# Patient Record
Sex: Male | Born: 1950 | Race: Black or African American | Hispanic: No | Marital: Married
Health system: Southern US, Community
[De-identification: ages and names within clinical notes are randomized; demographics above are authoritative.]

---

## 2004-07-26 ENCOUNTER — Inpatient Hospital Stay (HOSPITAL_COMMUNITY): Admission: EM | Admit: 2004-07-26 | Discharge: 2004-07-30 | Payer: Self-pay | Admitting: Emergency Medicine

## 2005-03-03 ENCOUNTER — Ambulatory Visit: Payer: Self-pay | Admitting: Psychiatry

## 2005-03-03 ENCOUNTER — Other Ambulatory Visit (HOSPITAL_COMMUNITY): Admission: RE | Admit: 2005-03-03 | Discharge: 2005-06-01 | Payer: Self-pay | Admitting: Psychiatry

## 2005-05-04 ENCOUNTER — Other Ambulatory Visit (HOSPITAL_COMMUNITY): Admission: RE | Admit: 2005-05-04 | Discharge: 2005-08-02 | Payer: Self-pay | Admitting: Psychiatry

## 2005-11-20 IMAGING — CT CT HEAD W/O CM
1 of 2 series · 13 of 30 positions shown, 17 images · non-contrast
Comparison: 07/26/04.

CLINICAL DATA: Followup subarachnoid hemorrhage.
 CT HEAD WITHOUT CONTRAST

[Series 2: brain · axial · 0.47mm/px · z∈[+164,+282]mm · 13 of 40 slices shown, 17 images]
[im 3/40  brain]
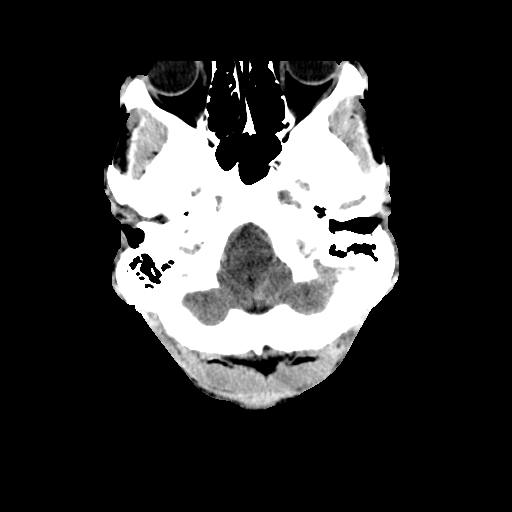
[im 3/40  bone]
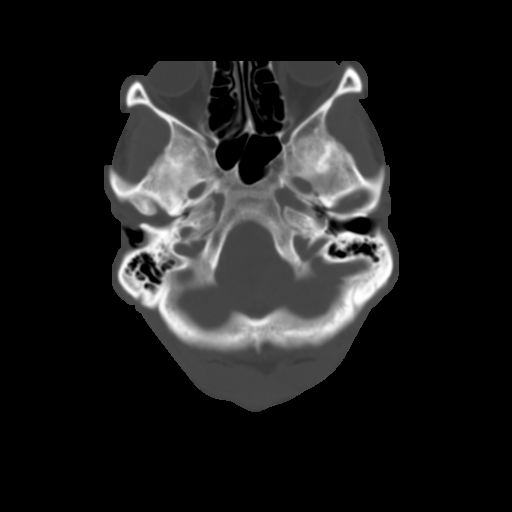
[im 6/40  brain]
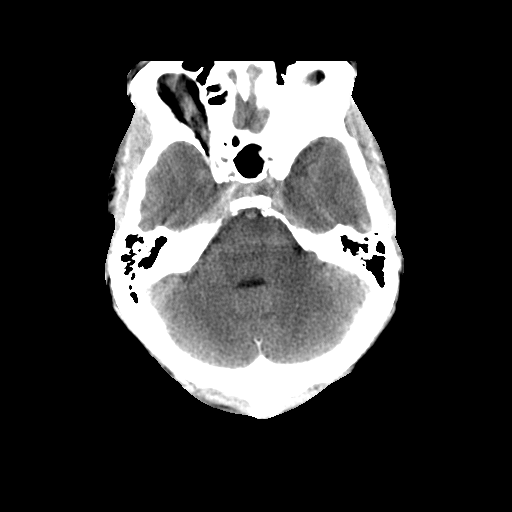
[im 9/40  brain]
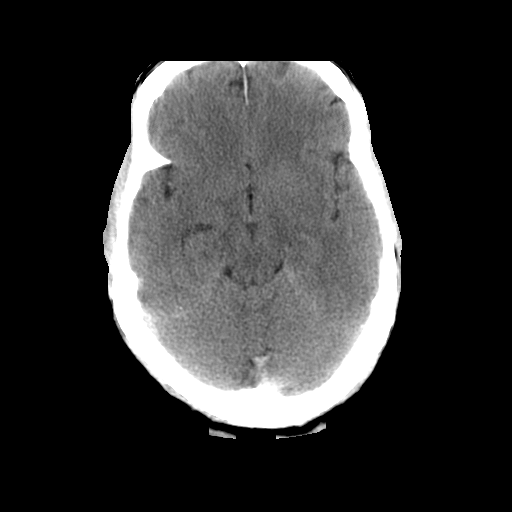
[im 12/40  brain]
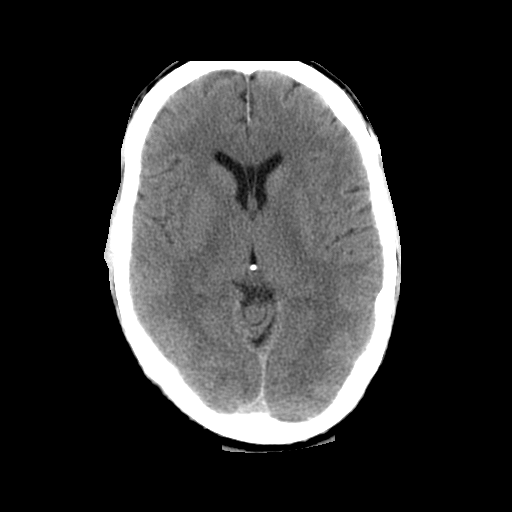
[im 14/40  brain]
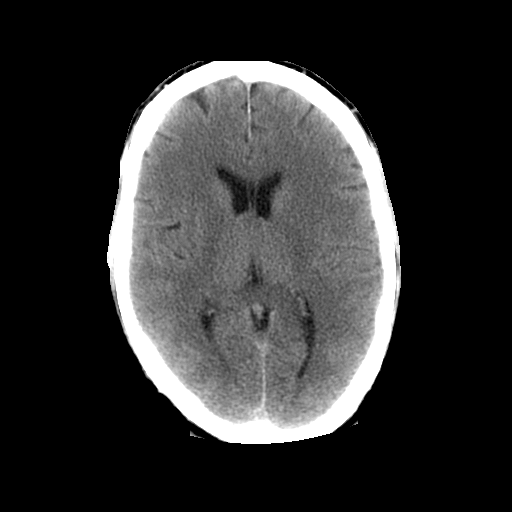
[im 14/40  bone]
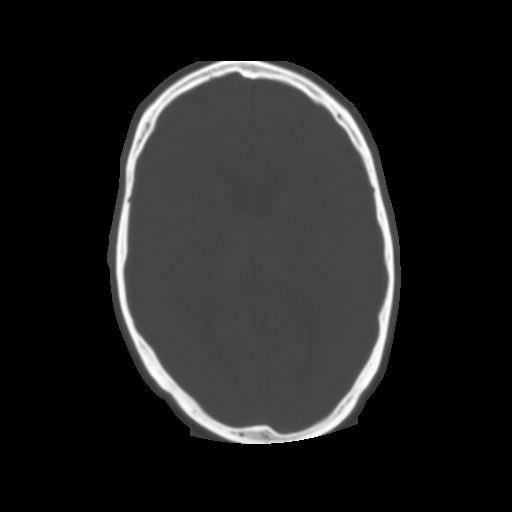
[im 17/40  brain]
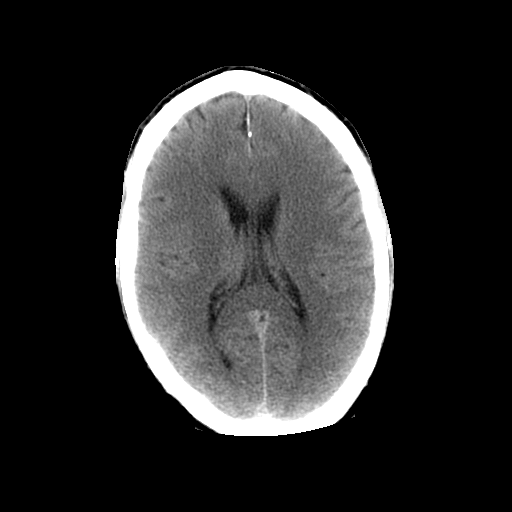
[im 20/40  brain]
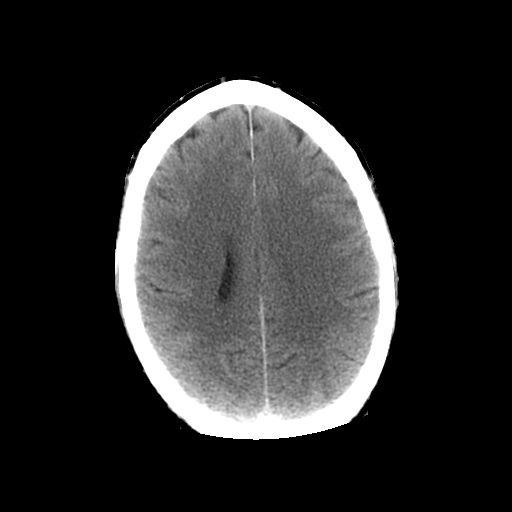
[im 23/40  brain]
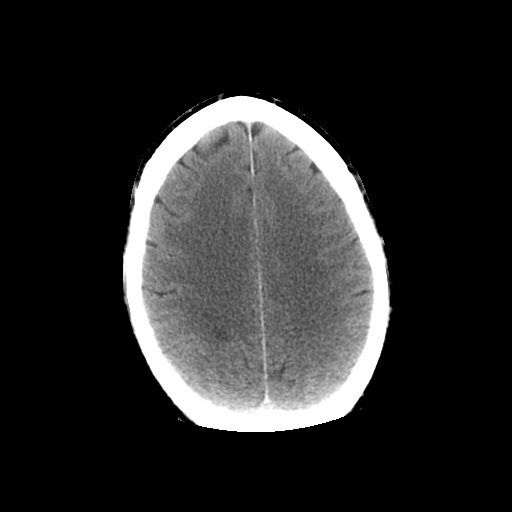
[im 26/40  brain]
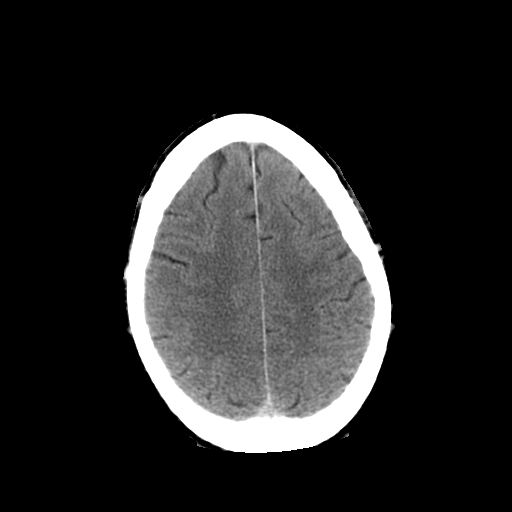
[im 26/40  bone]
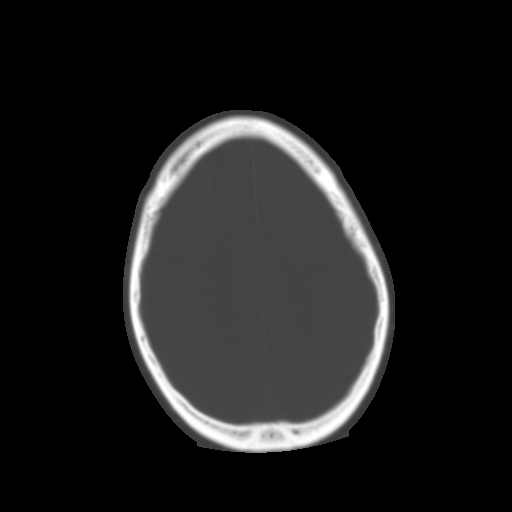
[im 28/40  brain]
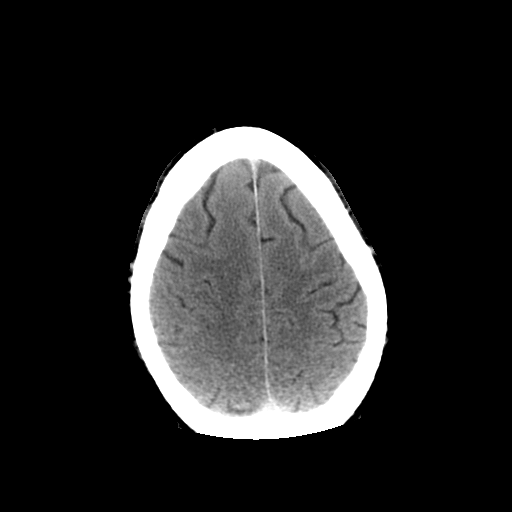
[im 31/40  brain]
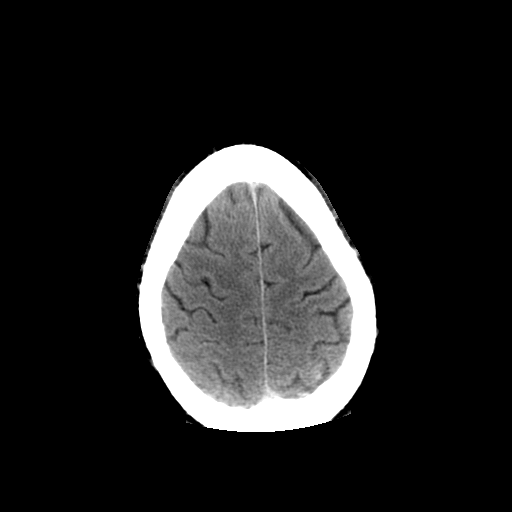
[im 34/40  brain]
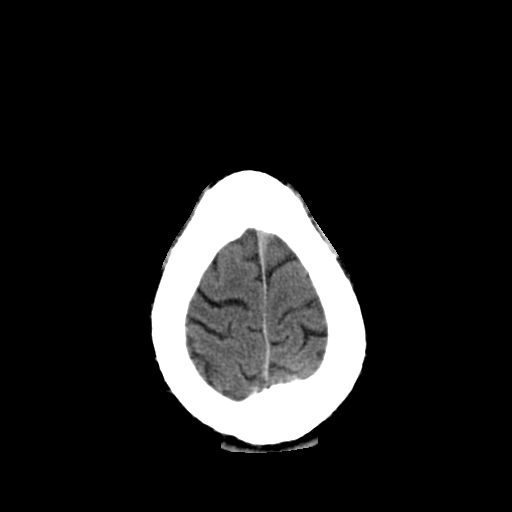
[im 37/40  brain]
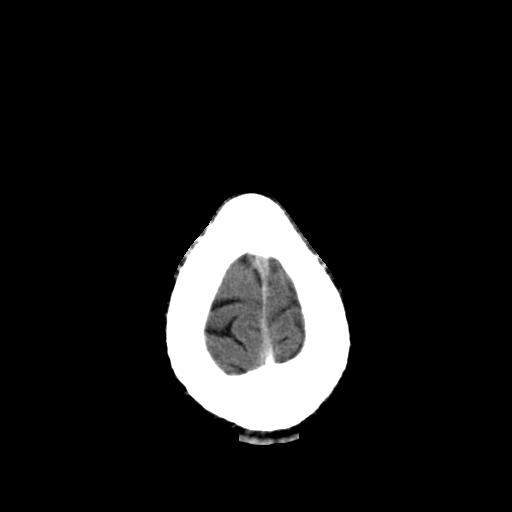
[im 37/40  bone]
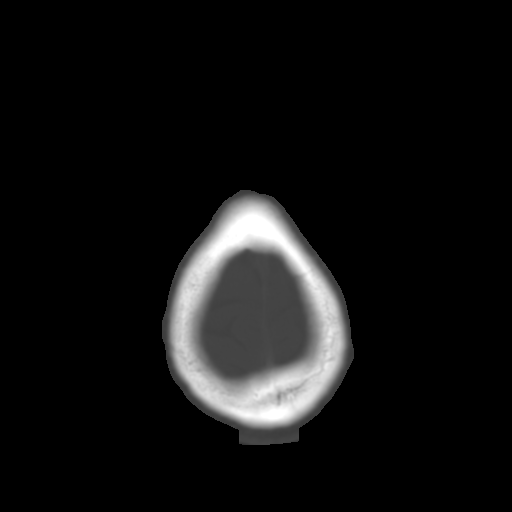

[13 of 30 positions shown; findings below may reference images not displayed]

The previously described perimesencephalic subarachnoid hemorrhage as well as a small amount of blood in the fourth ventricle have both essentially completely resolved. No new areas of hemorrhage seen.  No hydrocephalus. 
 IMPRESSION
 Interval resolution of perimesencephalic subarachnoid hemorrhage and blood within the fourth ventricle.  No hyhdrocephalus.

## 2006-01-02 ENCOUNTER — Inpatient Hospital Stay (HOSPITAL_COMMUNITY): Admission: AD | Admit: 2006-01-02 | Discharge: 2006-01-07 | Payer: Self-pay | Admitting: Psychiatry

## 2006-01-04 ENCOUNTER — Ambulatory Visit: Payer: Self-pay | Admitting: Psychiatry

## 2020-01-28 ENCOUNTER — Ambulatory Visit: Payer: Medicare PPO | Attending: Internal Medicine

## 2020-01-28 DIAGNOSIS — Z23 Encounter for immunization: Secondary | ICD-10-CM | POA: Insufficient documentation

## 2020-01-28 NOTE — Progress Notes (Signed)
   Covid-19 Vaccination Clinic  Name:  Eric Nichols    MRN: [id number] DOB: [birth year]  01/28/2020  Mr. Eric Nichols was observed post Covid-19 immunization for 15 minutes without incidence. He was provided with Vaccine Information Sheet and instruction to access the V-Safe system.   Mr. Eric Nichols was instructed to call 911 with any severe reactions post vaccine: Marland Kitchen Difficulty breathing  . Swelling of your face and throat  . A fast heartbeat  . A bad rash all over your body  . Dizziness and weakness    Immunizations Administered    Name Date Dose VIS Date Route   Pfizer COVID-19 Vaccine 01/28/2020  2:22 PM 0.3 mL 11/10/2019 Intramuscular   Manufacturer: [employer], [employer]   Lot: [id number]   NDC: [address]-9

## 2020-02-20 ENCOUNTER — Ambulatory Visit: Payer: Medicare PPO | Attending: Internal Medicine

## 2020-02-20 DIAGNOSIS — Z23 Encounter for immunization: Secondary | ICD-10-CM

## 2020-02-20 NOTE — Progress Notes (Signed)
   Covid-19 Vaccination Clinic  Name:  Eric Nichols    MRN: [id number] DOB: Oct 15, 1951  02/20/2020  [name] was observed post Covid-19 immunization for 15 minutes without incident. He was provided with Vaccine Information Sheet and instruction to access the V-Safe system.   [name] was instructed to call 911 with any severe reactions post vaccine: Marland Kitchen Difficulty breathing  . Swelling of face and throat  . A fast heartbeat  . A bad rash all over body  . Dizziness and weakness   Immunizations Administered    Name Date Dose VIS Date Route   Pfizer COVID-19 Vaccine 02/20/2020 12:26 PM 0.3 mL 11/10/2019 Intramuscular   Manufacturer: [employer], [employer]   Lot: [id number]   NDC: [address]-0

## 2022-05-28 ENCOUNTER — Ambulatory Visit (HOSPITAL_COMMUNITY)
Admission: EM | Admit: 2022-05-28 | Discharge: 2022-05-28 | Disposition: A | Discharge status: Home / Self Care | Payer: Medicare PPO | Attending: Psychiatry | Admitting: Psychiatry

## 2022-05-28 DIAGNOSIS — F1410 Cocaine abuse, uncomplicated: Secondary | ICD-10-CM | POA: Insufficient documentation
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
## 2022-05-28 NOTE — Discharge Instructions (Addendum)
Your contact information has been forwarded to [employer], [employer], [employer].  You should be contacted either tomorrow or next week. The address and phone number for the office is below.  [facility] [address] [place], [place], [address] [phone] phone   Below are the other providers discussed that accept Medicare:  [facility], [facility] [address], [place], [address] [phone] phone  [facility] ---- This is the one with substance treatment for veterans. [address] [place], [place], [address] [phone] office [phone] fax  Jovita Kussmaul [address] [place], [place], [address] [phone] phone  [facility] [address] [place], [place], [address] [phone] phone  [facility] [address] [place], [place], [address] [phone] phone
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
## 2022-05-28 NOTE — BH Assessment (Signed)
LCSW Progress Note   Per Kelle Darting, NP, this pt does not require psychiatric hospitalization at this time.  Pt is psychiatrically cleared.  Discharge instructions include resources for substance use treatment - outpatient.  EDP Kelle Darting, NP, has been notified.  Hansel Starling, MSW, LCSW [facility] [phone] or [phone]

## 2022-05-28 NOTE — ED Provider Notes (Addendum)
Behavioral Health Urgent Care Medical Screening Exam  Patient Name: Eric Nichols MRN: [id number] Date of Evaluation: 05/28/22 Chief Complaint:   Diagnosis:  Final diagnoses:  Cocaine abuse (HCC)   History of Present illness: Eric Nichols is a 71 y.o. male. Pt presents to [facility] escorted by police under IVC petition by [employer]. Per IVC petition, "THE PETITIONER STATES: THE RESPONDENT HAS NOT BEEN EATING SLEEPING OR TENDING TO HIS PERSONAL HYGIENE. THE RESPONDENT HAS BEEN DIAGNOSED WITH A MENTAL ILLNESS. THE RESPONDENT HAS BEEN SMOKING CRACK FOR 40 YEARS AND CONTINUES TO USE DRUGS AND STAYS AWAY FROM HOME FOR DAYS WITHOUT CONTACTING HIS FAMILY. THE RESPONDENT WILL SPEND EVERY PENNY HE GETS FOR THE MONTH ON DRUGS. LAST SUNDAY THE RESPONDENT BEGAN USING AGAIN AND DISAPPEARED FROM HOME FOR SEVERAL DAYS, WHEN HE RETURNED HE STAYED UP ALL NIGHT DRINKING AND CONSUMING DRUGS. THE RESPONDENT HAS FOLLOWED THIS PATTERN OF DISAPPEARING AND BINGEING ON DRUGS 3 TIMES THIS MONTH. THE RESPONDENT IS VERY APOLOGETIC WHEN HE RETURNS HOME, A COUPLE OF WEEKS AGO WHEN HE RETURNED HE COULD NOT PUT THE KEY IN THE KEY HOLE. THE RESPONDENT BEGAN SOBING CRAWLING ON THE FLOOR BEGGING GOD FOR HELP. IN ADDITION, THE RESPONDENT HALLUCINATES/HEARS VOICES AND HE BELIEVES HE SEES HELL AND OTHER THINGS THAT ARE NOT PRESENT."  Pt w/ reported hx of cocaine abuse.   Pt reports he was brought to this facility today because his wife called the police. Pt states his wife did warn him earlier that she would call the police if he continued to use crack/cocaine. Pt states he has been using crack/cocaine "for years". He reports his pattern of crack/cocaine use is to go on 2 to 3 day binges, be away from the home, and then have a period of sobriety before using again. States his last use of crack/cocaine was yesterday 11PM. He endorses when he uses crack/cocaine, he will have poor appetite and sleep. States last night he slept for 4 hours.  States he is currently feeling tired. States he will experience AVH and paranoia when using crack/cocaine. Denies AVH or paranoia outside of context of using crack/cocaine. States he experiences AH of "voices of what I'm doing wrong, thinking there's someone in the bathroom speaking to me". States he experiences VH of "soldiers in the woods". Per pt, he is using as much as $1000/month on crack/cocaine. Reports longest period of sobriety was from 1992-2005.   He reports he uses alcohol once/week, 1 beer/occasion, last use was last night. He denies use of marijuana, fentanyl/heroin, other substances.   Pt is currently working part time as a Lawyer. He lives w/ his wife, who he states he has been married to for 50 years. He reports the highest degree he received was college.  Pt endorses current euthymic mood. He denies SI/VI/HI, AVH, paranoia.   Pt denies hx of NSSI, SA. Per chart review, he has had 1 inpatient psychiatric hospitalization from 01/02/2006-01/07/2006. Pt states he has hx of being in detox and rehab for substance abuse. He believes he has been in detox or rehab for substance abuse 7 or 8 times. He cannot recall when the last time he went was.  Pt denies knowledge of family psychiatric history although does believe there is substance abuse hx.  Pt denies access to a firearm or other weapon.   Psychoeducation provided on crack/cocaine and effects on the body. Discussed concerns regarding continued use. Pt verbalized understanding.  Pt requesting resources for outpatient substance use treatment. LCSW consulted who  spoke w/ pt and provided resources.  Collateral was attempted w/ Bufford Spikes unsuccessfully.   Pt is a&ox3, in no acute distress, non-toxic appearing. He appears fairly groomed, casually dressed, appropriate for environment. He makes good eye contact. His speech is clear and coherent, w/ normal rate and volume. Reported mood is euthymic. Affect is appropriate, full  range. TP is coherent, goal directed, linear. Description of associations is intact. TC is logical. There is no evidence of responding to internal stimuli, agitation, aggression or distractibility. No delusions or paranoia elicited. Pt is calm, cooperative pleasant. He converses coherently and appropriately throughout the assessment.   Psychiatric Specialty Exam  Presentation  General Appearance:Appropriate for Environment; Casual; Fairly Groomed  Eye Contact:Good  Speech:Clear and Coherent; Normal Rate  Speech Volume:Normal  Handedness:No data recorded  Mood and Affect  Mood:Euthymic  Affect:Appropriate; Full Range   Thought Process  Thought Processes:Coherent; Goal Directed; Linear  Descriptions of Associations:Intact  Orientation:Full (Time, Place and Person)  Thought Content:Logical    Hallucinations:None  Ideas of Reference:None  Suicidal Thoughts:No  Homicidal Thoughts:No   Sensorium  Memory:Immediate Good; Recent Fair  Judgment:Intact  Insight:Fair   Executive Functions  Concentration:Fair  Attention Span:Fair  Recall:Fair  Fund of Knowledge:Fair  Language:Fair   Psychomotor Activity  Psychomotor Activity:Normal   Assets  Assets:Communication Skills; Desire for Improvement; Financial Resources/Insurance; Housing; Social Support   Sleep  Sleep:Poor  Number of hours: 4   No data recorded  Physical Exam: Physical Exam Cardiovascular:     Rate and Rhythm: Normal rate.  Pulmonary:     Effort: Pulmonary effort is normal.  Neurological:     Mental Status: He is alert and oriented to person, place, and time.  Psychiatric:        Attention and Perception: Attention and perception normal.        Mood and Affect: Mood and affect normal.        Speech: Speech normal.        Behavior: Behavior normal. Behavior is cooperative.        Thought Content: Thought content normal.        Cognition and Memory: Cognition and memory normal.         Judgment: Judgment normal.    Review of Systems  Constitutional:  Negative for chills and fever.  Eyes:  Negative for blurred vision.  Respiratory:  Negative for shortness of breath.   Cardiovascular:  Negative for chest pain and palpitations.  Gastrointestinal:  Negative for abdominal pain.  Neurological:  Negative for dizziness and headaches.  Psychiatric/Behavioral:  Positive for substance abuse. The patient has insomnia.    Blood pressure (!) 164/99, pulse 81, temperature 98.2 F (36.8 C), resp. rate 18, SpO2 99 %. There is no height or weight on file to calculate BMI.  Musculoskeletal: Strength & Muscle Tone: within normal limits Gait & Station: normal Patient leans: N/A  [facility] MSE Discharge Disposition for Follow up and Recommendations: Based on my evaluation the patient does not appear to have an emergency medical condition and can be discharged with resources and follow up care in outpatient services for substance abuse treatment  Lauree Chandler, NP 05/28/2022, 4:56 PM

## 2022-05-29 ENCOUNTER — Telehealth (HOSPITAL_COMMUNITY): Payer: Self-pay | Admitting: Licensed Clinical Social Worker

## 2022-05-29 NOTE — Telephone Encounter (Signed)
The therapist reaches out to Eric Nichols confirming his identify via two identifiers.  Eric Nichols already had an assessment today at the [facility] and is to start their SA IOP.  The therapist makes Eric Nichols aware of the SA IOP at [facility] and providers his contact number should Eric Nichols having any questions or concerns in the future.   Myrna Blazer, MA, LCSW, [facility], LCAS 05/29/2022

## 2023-11-02 ENCOUNTER — Emergency Department (HOSPITAL_COMMUNITY)
Admission: EM | Admit: 2023-11-02 | Discharge: 2023-11-02 | Disposition: A | Discharge status: Home / Self Care | Payer: Medicare PPO | Attending: Emergency Medicine | Admitting: Emergency Medicine

## 2023-11-02 ENCOUNTER — Emergency Department (HOSPITAL_COMMUNITY): Payer: Medicare PPO

## 2023-11-02 ENCOUNTER — Other Ambulatory Visit: Payer: Self-pay

## 2023-11-02 ENCOUNTER — Encounter (HOSPITAL_COMMUNITY): Payer: Self-pay

## 2023-11-02 DIAGNOSIS — M47814 Spondylosis without myelopathy or radiculopathy, thoracic region: Secondary | ICD-10-CM | POA: Diagnosis not present

## 2023-11-02 DIAGNOSIS — I6782 Cerebral ischemia: Secondary | ICD-10-CM | POA: Diagnosis not present

## 2023-11-02 DIAGNOSIS — F10129 Alcohol abuse with intoxication, unspecified: Secondary | ICD-10-CM | POA: Diagnosis not present

## 2023-11-02 DIAGNOSIS — J40 Bronchitis, not specified as acute or chronic: Secondary | ICD-10-CM | POA: Diagnosis not present

## 2023-11-02 DIAGNOSIS — R5383 Other fatigue: Secondary | ICD-10-CM | POA: Diagnosis not present

## 2023-11-02 DIAGNOSIS — S199XXA Unspecified injury of neck, initial encounter: Secondary | ICD-10-CM | POA: Diagnosis not present

## 2023-11-02 DIAGNOSIS — F4489 Other dissociative and conversion disorders: Secondary | ICD-10-CM | POA: Diagnosis not present

## 2023-11-02 DIAGNOSIS — S0990XA Unspecified injury of head, initial encounter: Secondary | ICD-10-CM | POA: Diagnosis not present

## 2023-11-02 LAB — CBC
HCT: 47.3 % (ref 39.0–52.0)
Hemoglobin: 15.5 g/dL (ref 13.0–17.0)
MCH: 29.6 pg (ref 26.0–34.0)
MCHC: 32.8 g/dL (ref 30.0–36.0)
MCV: 90.4 fL (ref 80.0–100.0)
Platelets: 331 10*3/uL (ref 150–400)
RBC: 5.23 MIL/uL (ref 4.22–5.81)
RDW: 12.5 % (ref 11.5–15.5)
WBC: 5.7 10*3/uL (ref 4.0–10.5)
nRBC: 0 % (ref 0.0–0.2)

## 2023-11-02 LAB — BASIC METABOLIC PANEL
Anion gap: 9 (ref 5–15)
BUN: 16 mg/dL (ref 8–23)
CO2: 29 mmol/L (ref 22–32)
Calcium: 9.6 mg/dL (ref 8.9–10.3)
Chloride: 97 mmol/L — ABNORMAL LOW (ref 98–111)
Creatinine, Ser: 0.96 mg/dL (ref 0.61–1.24)
GFR, Estimated: >60 mL/min (ref >60)
Glucose, Bld: 96 mg/dL (ref 70–99)
Potassium: 3.2 mmol/L — ABNORMAL LOW (ref 3.5–5.1)
Sodium: 135 mmol/L (ref 135–145)

## 2023-11-02 LAB — ETHANOL: Alcohol, Ethyl (B): <10 mg/dL (ref <10)

## 2023-11-02 NOTE — Discharge Instructions (Signed)
The test today in the ED did not show any signs of any serious injury infection anemia or dehydration.  Follow-up with your primary care doctor to be rechecked.
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
